# Patient Record
Sex: Female | Born: 1980 | Race: Black or African American | Hispanic: No | Marital: Single | Smoking: Never smoker
Health system: Southern US, Community
[De-identification: ages and names within clinical notes are randomized; demographics above are authoritative.]

## PROBLEM LIST (undated history)

## (undated) DIAGNOSIS — T7840XA Allergy, unspecified, initial encounter: Secondary | ICD-10-CM

## (undated) HISTORY — PX: CHOLECYSTECTOMY: SHX55

## (undated) HISTORY — DX: Allergy, unspecified, initial encounter: T78.40XA

---

## 2010-02-14 ENCOUNTER — Emergency Department (HOSPITAL_COMMUNITY)
Admission: EM | Admit: 2010-02-14 | Discharge: 2010-02-14 | Disposition: A | Discharge status: Home / Self Care | Payer: Self-pay | Attending: Emergency Medicine | Admitting: Emergency Medicine

## 2010-02-14 DIAGNOSIS — Z0389 Encounter for observation for other suspected diseases and conditions ruled out: Secondary | ICD-10-CM | POA: Insufficient documentation

## 2012-04-24 ENCOUNTER — Ambulatory Visit (INDEPENDENT_AMBULATORY_CARE_PROVIDER_SITE_OTHER): Payer: BC Managed Care – PPO | Admitting: Internal Medicine

## 2012-04-24 VITALS — BP 114/72 | HR 81 | Temp 98.9°F | Resp 17 | Ht 63.5 in | Wt 142.0 lb

## 2012-04-24 DIAGNOSIS — Z0000 Encounter for general adult medical examination without abnormal findings: Secondary | ICD-10-CM

## 2012-04-24 DIAGNOSIS — T7840XA Allergy, unspecified, initial encounter: Secondary | ICD-10-CM | POA: Insufficient documentation

## 2012-04-24 DIAGNOSIS — Z91030 Bee allergy status: Secondary | ICD-10-CM

## 2012-04-24 DIAGNOSIS — D1803 Hemangioma of intra-abdominal structures: Secondary | ICD-10-CM

## 2012-04-24 DIAGNOSIS — Z91038 Other insect allergy status: Secondary | ICD-10-CM

## 2012-04-24 MED ORDER — EPINEPHRINE 0.3 MG/0.3ML IJ DEVI: 0.3000 mg | Freq: Once | INTRAMUSCULAR | Status: DC

## 2012-04-24 NOTE — Progress Notes (Signed)
  Tuberculosis Risk Questionnaire  1. Were you born outside the [place] in one of the following parts of the world:    [place], [place], [place], [place] or [place]?  No  2. Have you traveled outside the [place] and lived for more than one month in one of the following parts of the world:  [place], [place], [place], [place] or [place]?  Yes/briefly to [employer]  3. Do you have a compromised immune system such as from any of the following conditions:  HIV/AIDS, organ or bone marrow transplantation, diabetes, immunosuppressive   medicines (e.g. Prednisone, Remicaide), leukemia, lymphoma, cancer of the   head or neck, gastrectomy or jejunal bypass, end-stage renal disease (on   dialysis), or silicosis?  No    4. Have you ever done one of the following:    Used crack cocaine, injected illegal drugs, worked or resided in jail or prison,   worked or resided at a homeless shelter, or worked as a Research scientist (physical sciences) in   direct contact with patients?  no             Yes           no  5. Have you ever been exposed to anyone with infectious tuberculosis?  No   Tuberculosis Symptom Questionnaire  Do you currently have any of the following symptoms?  1. Unexplained cough lasting more than 3 weeks? No  Unexplained fever lasting more than 3 weeks. No   3. Night Sweats (sweating that leaves the bedclothes and sheets wet)   No  4. Shortness of Breath No  5. Chest Pain No  6. Unintentional weight loss  No  7. Unexplained fatigue (very tired for no reason) No  To Whom It May Concern:  This patient was screened for tuberculosis using current guidelines from the Centers for Disease Control, and does not have tuberculosis in the communicable form.

## 2012-04-26 NOTE — Progress Notes (Signed)
  Subjective:    Patient ID: Leah Green, female    DOB: [birth year], 32 y.o.   MRN: [id number]  HPI she wants an annual checkup with Pap smear and consideration for exposure to communicable diseases in preparation for a new job She currently remains asymptomatic with the following problems Patient Active Problem List  Diagnosis  . Hemangioma of intra-abdominal structure since age 2 -multiple procedures have not been able to stop this -she has never been anemic from it-it has been several years since that has been checked and her problems are currently infrequent so she wishes to do nothing else at this point   . 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  HENT: Negative.   Eyes: Negative.   Respiratory: Negative for cough, shortness of breath and wheezing.   Cardiovascular: Negative for chest pain, palpitations and leg swelling.  Gastrointestinal: Negative.   Endocrine: Negative.   Genitourinary: Negative.   Musculoskeletal: Negative.   Skin: Negative.   Allergic/Immunologic: Negative.   Neurological: Negative.   Hematological: Negative.   Psychiatric/Behavioral: Negative.        Objective:   Physical Exam  Constitutional: She is oriented to person, place, and time. She appears well-developed and well-nourished.  HENT:  Head: Normocephalic.  Right Ear: External ear normal.  Left Ear: External ear normal.  Nose: Nose normal.  Mouth/Throat: Oropharynx is  clear and moist.  Eyes: Conjunctivae and EOM are normal. Pupils are equal, round, and reactive to light.  Neck: Normal range of motion. Neck supple. No thyromegaly present.  Cardiovascular: Normal rate and regular rhythm.   Pulmonary/Chest: Effort normal.  Musculoskeletal: Normal range of motion. She exhibits no edema.  Neurological: She is alert and oriented to person, place, and time. No cranial nerve deficit.  Skin: No rash noted.  Psychiatric: She has a normal mood and affect. Her behavior is normal. Judgment and thought content normal.          Assessment & Plan:  Annual exam   Problems Allergic to bees - Plan: EPINEPHrine (EPIPEN) 0.3 mg/0.3 mL DEVI  Hemangioma of intra-abdominal structure ??--Last colonoscopy 2008/no need for followup at this point She declines further lab work   Letter regarding TB

## 2012-08-14 ENCOUNTER — Ambulatory Visit: Payer: BC Managed Care – PPO

## 2012-08-14 ENCOUNTER — Ambulatory Visit (INDEPENDENT_AMBULATORY_CARE_PROVIDER_SITE_OTHER): Payer: BC Managed Care – PPO | Admitting: Internal Medicine

## 2012-08-14 VITALS — BP 98/60 | HR 81 | Temp 97.9°F | Resp 18 | Ht 63.5 in | Wt 142.0 lb

## 2012-08-14 DIAGNOSIS — M25579 Pain in unspecified ankle and joints of unspecified foot: Secondary | ICD-10-CM

## 2012-08-14 DIAGNOSIS — M25532 Pain in left wrist: Secondary | ICD-10-CM

## 2012-08-14 DIAGNOSIS — S42309S Unspecified fracture of shaft of humerus, unspecified arm, sequela: Secondary | ICD-10-CM

## 2012-08-14 DIAGNOSIS — S62102S Fracture of unspecified carpal bone, left wrist, sequela: Secondary | ICD-10-CM

## 2012-08-14 MED ORDER — IBUPROFEN 600 MG PO TABS: 600.0000 mg | ORAL_TABLET | Freq: Three times a day (TID) | ORAL | Status: DC | PRN

## 2012-08-14 NOTE — Patient Instructions (Signed)
Wrist Pain  Wrist injuries are frequent in adults and children. A sprain is an injury to the ligaments that hold your bones together. A strain is an injury to muscle or muscle cord-like structures (tendons) from stretching or pulling. Generally, when wrists are moderately tender to touch following a fall or injury, a break in the bone (fracture) may be present. Most wrist sprains or strains are better in 3 to 5 days, but complete healing may take several weeks.  HOME CARE INSTRUCTIONS    Put ice on the injured area.   Put ice in a plastic bag.   Place a towel between your skin and the bag.   Leave the ice on for 15-20 minutes, 3-4 times a day, for the first 2 days.   Keep your arm raised above the level of your heart whenever possible to reduce swelling and pain.   Rest the injured area for at least 48 hours or as directed by your caregiver.   If a splint or elastic bandage has been applied, use it for as long as directed by your caregiver or until seen by a caregiver for a follow-up exam.   Only take over-the-counter or prescription medicines for pain, discomfort, or fever as directed by your caregiver.   Keep all follow-up appointments. You may need to follow up with a specialist or have follow-up X-rays. Improvement in pain level is not a guarantee that you did not fracture a bone in your wrist. The only way to determine whether or not you have a broken bone is by X-ray.  SEEK IMMEDIATE MEDICAL CARE IF:    Your fingers are swollen, very red, white, or cold and blue.   Your fingers are numb or tingling.   You have increasing pain.   You have difficulty moving your fingers.  MAKE SURE YOU:    Understand these instructions.   Will watch your condition.   Will get help right away if you are not doing well or get worse.  Document Released: 10/10/2004 Document Revised: 03/25/2011 Document Reviewed: 02/21/2010  [employer] Patient Information 2014 [employer].
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
## 2012-08-26 ENCOUNTER — Other Ambulatory Visit: Payer: Self-pay | Admitting: Orthopaedic Surgery

## 2012-08-26 DIAGNOSIS — M25532 Pain in left wrist: Secondary | ICD-10-CM

## 2012-09-08 ENCOUNTER — Ambulatory Visit
Admission: RE | Admit: 2012-09-08 | Discharge: 2012-09-08 | Disposition: A | Discharge status: Home / Self Care | Payer: BC Managed Care – PPO | Source: Ambulatory Visit | Attending: Orthopaedic Surgery | Admitting: Orthopaedic Surgery

## 2012-09-08 DIAGNOSIS — M25532 Pain in left wrist: Secondary | ICD-10-CM

## 2012-09-08 MED ORDER — IOHEXOL 180 MG/ML  SOLN
3.0000 mL | Freq: Once | INTRAMUSCULAR | Status: AC | PRN
  Administered 2012-09-08: 1 mL via INTRA_ARTICULAR

## 2012-12-14 ENCOUNTER — Ambulatory Visit (INDEPENDENT_AMBULATORY_CARE_PROVIDER_SITE_OTHER): Payer: BC Managed Care – PPO | Admitting: Physician Assistant

## 2012-12-14 VITALS — BP 108/70 | HR 90 | Temp 98.6°F | Resp 16 | Ht 63.5 in | Wt 144.0 lb

## 2012-12-14 DIAGNOSIS — Z113 Encounter for screening for infections with a predominantly sexual mode of transmission: Secondary | ICD-10-CM

## 2012-12-14 DIAGNOSIS — L309 Dermatitis, unspecified: Secondary | ICD-10-CM

## 2012-12-14 DIAGNOSIS — L259 Unspecified contact dermatitis, unspecified cause: Secondary | ICD-10-CM

## 2012-12-14 MED ORDER — MOMETASONE FUROATE 0.1 % EX OINT: TOPICAL_OINTMENT | Freq: Every day | CUTANEOUS | Status: DC

## 2012-12-14 NOTE — Patient Instructions (Signed)
Continue using the Elocon ointment as directed.  I will let you know when your labs are back and if we need to do anything based on those   Safe Sex Safe sex is about reducing the risk of giving or getting a sexually transmitted disease (STD). STDs are spread through sexual contact involving the genitals, mouth, or rectum. Some STDS can be cured and others cannot. Safe sex can also prevent unintended pregnancies.  SAFE SEX PRACTICES  Limit your sexual activity to only one partner who is only having sex with you.  Talk to your partner about their past partners, past STDs, and drug use.  Use a condom every time you have sexual intercourse. This includes vaginal, oral, and anal sexual activity. Both females and males should wear condoms during oral sex. Only use latex or polyurethane condoms and water-based lubricants. Petroleum-based lubricants or oils used to lubricate a condom will weaken the condom and increase the chance that it will break. The condom should be in place from the beginning to the end of sexual activity. Wearing a condom reduces, but does not completely eliminate, your risk of getting or giving a STD. STDs can be spread by contact with skin of surrounding areas.  Get vaccinated for hepatitis B and HPV.  Avoid alcohol and recreational drugs which can affect your judgement. You may forget to use a condom or participate in high-risk sex.  For females, avoid douching after sexual intercourse. Douching can spread an infection farther into the reproductive tract.  Check your body for signs of sores, blisters, rashes, or unusual discharge. See your caregiver if you notice any of these signs.  Avoid sexual contact if you have symptoms of an infection or are being treated for an STD. If you or your partner has herpes, avoid sexual contact when blisters are present. Use condoms at all other times.  See your caregiver for regular screenings, examinations, and tests for STDs. Before  having sex with a new partner, each of you should be screened for STDs and talk about the results with your partner. BENEFITS OF SAFE SEX   There is less of a chance of getting or giving an STD.  You can prevent unwanted or unintended pregnancies.  By discussing safer sex concerns with your partner, you may increase feelings of intimacy, comfort, trust, and honesty between the both of you. Document Released: 02/08/2004 Document Revised: 09/25/2011 Document Reviewed: 06/24/2011 [facility] Patient Information 2014 [place], [place].

## 2012-12-14 NOTE — Progress Notes (Signed)
   Subjective:    Patient ID: Leah Green, female    DOB: 08-22-1980, 32 y.o.   MRN: [id number]  HPI   Leah Green is a very pleasant 32 yr old female here with two requests today: (1)  Long history of eczema since childhood.  "Allergic to my own body fluids."  Sweat in particular exacerbates symptoms.  Treated with Elocon ointment since childhood.  Does not use daily, only with flare ups.   (2)  Requests STI testing today.  Last tested more than 6 months ago.  Sexually active with 1 female partner.  They use condoms about 90% of the time.  They are in a long-distance relationship.  She is not using any other contraception and is not trying to conceive.  Not interested in contraception today.  LMP 11/16/12, regular every month.   Review of Systems  Constitutional: Negative for fever and chills.  Respiratory: Negative for cough, shortness of breath and wheezing.   Gastrointestinal: Negative.   Musculoskeletal: Negative.   Skin: Negative for rash and wound.  Neurological: Negative.        Objective:   Physical Exam  Vitals reviewed. Constitutional: She is oriented to person, place, and time. She appears well-developed and well-nourished. No distress.  HENT:  Head: Normocephalic and atraumatic.  Eyes: Conjunctivae are normal. No scleral icterus.  Cardiovascular: Normal rate, regular rhythm and normal heart sounds.   Pulmonary/Chest: Effort normal and breath sounds normal. She has no wheezes. She has no rales.  Abdominal: Soft. There is no tenderness.  Neurological: She is alert and oriented to person, place, and time.  Skin: Skin is warm and dry.  Psychiatric: She has a normal mood and affect. Her behavior is normal.       Assessment & Plan:  Screen for STD (sexually transmitted disease) - Plan: GC/Chlamydia Probe Amp, RPR, HIV antibody  Eczema - Plan: mometasone (ELOCON) 0.1 % ointment   Leah Green is a 32 yr old female here for STD testing.  Self-collected gen probe, HIV,  and RPR sent.  Will follow up on labs and treat if necessary.  I have also refilled Elocon ointment for pt.  To RTC as needs arise.   Meds ordered this encounter  Medications  . DISCONTD: mometasone (ELOCON) 0.1 % ointment    Sig: Apply topically daily.  . mometasone (ELOCON) 0.1 % ointment    Sig: Apply topically daily.    Dispense:  45 g    Refill:  1    Order Specific Question:  Supervising Provider    Answer:  Nilda Simmer M [[id number]]    Loleta Dicker MHS, PA-C [facility] 12/1/20143:54 PM

## 2013-04-03 ENCOUNTER — Ambulatory Visit (INDEPENDENT_AMBULATORY_CARE_PROVIDER_SITE_OTHER): Payer: BC Managed Care – PPO | Admitting: Physician Assistant

## 2013-04-03 VITALS — BP 100/70 | HR 73 | Temp 98.3°F | Resp 18 | Ht 63.75 in | Wt 145.0 lb

## 2013-04-03 DIAGNOSIS — L259 Unspecified contact dermatitis, unspecified cause: Secondary | ICD-10-CM

## 2013-04-03 DIAGNOSIS — Z113 Encounter for screening for infections with a predominantly sexual mode of transmission: Secondary | ICD-10-CM

## 2013-04-03 DIAGNOSIS — L309 Dermatitis, unspecified: Secondary | ICD-10-CM

## 2013-04-03 DIAGNOSIS — Z309 Encounter for contraceptive management, unspecified: Secondary | ICD-10-CM

## 2013-04-03 LAB — HIV ANTIBODY (ROUTINE TESTING W REFLEX): HIV: NONREACTIVE

## 2013-04-03 LAB — POCT URINE PREGNANCY: PREG TEST UR: NEGATIVE

## 2013-04-03 LAB — RPR

## 2013-04-03 MED ORDER — NORETHIN ACE-ETH ESTRAD-FE 1-20 MG-MCG PO TABS: 1.0000 | ORAL_TABLET | Freq: Every day | ORAL | Status: DC

## 2013-04-03 MED ORDER — MOMETASONE FUROATE 0.1 % EX OINT: TOPICAL_OINTMENT | Freq: Every day | CUTANEOUS | Status: DC

## 2013-04-03 NOTE — Patient Instructions (Signed)
Start the birth control pill today or tomorrow - make sure to take at about the same time every day.  It is not uncommon for women to have abnormal bleeding or spotting as your body adjusts to the medicine, but this typically normalizes by 3 months.  Continue using condoms with every encounter as this is the best way to protect yourself from infection  I think the Nexplanon may be a great option for you.  Let me know if you would like a referral to GYN for this  I will let you know when labs are back

## 2013-04-03 NOTE — Progress Notes (Signed)
   Subjective:    Patient ID: Leah Green, female    DOB: 1980/07/02, 33 y.o.   MRN: [id number]   HPI   Ms. Leah Green is a very pleasant 33 yr old female here requesting refill on Elocon ointment.  Pt uses prn for eczema.  She states she is "allergic" to her own body fluids, so any time she sweats she develops eczema.  She tolerates this medication well and has used for years  Additionally she would like STI testing.  Last testing in Dec 2014 - all neg at that time, but new partner since.  They are using condoms 100% of the time.  She is asymptomatic.  LMP 03/31/13 - periods irregular last couple of months.  She is interested in starting contraception today.  Previously unable to faithfully take pills.  Significant weight gain with depo.  No interested in NuvaRing or IUD.  She is open to Nexplanon but would like to start an OCP today.  She gets migraines and recalls being on a low dose pill previously.  She is a non smoker.  No personal or family hx blood clots.   Review of Systems  Constitutional: Negative.   Respiratory: Negative.   Cardiovascular: Negative.   Gastrointestinal: Negative.   Genitourinary: Negative.   Skin: Negative.        Objective:   Physical Exam  Vitals reviewed. Constitutional: She is oriented to person, place, and time. She appears well-developed and well-nourished. No distress.  HENT:  Head: Normocephalic and atraumatic.  Eyes: Conjunctivae are normal. No scleral icterus.  Cardiovascular: Normal rate, regular rhythm and normal heart sounds.   Pulmonary/Chest: Effort normal and breath sounds normal. She has no wheezes. She has no rales.  Neurological: She is alert and oriented to person, place, and time.  Skin: Skin is warm and dry.  Psychiatric: She has a normal mood and affect. Her behavior is normal.      Results for orders placed in visit on 04/03/13  POCT URINE PREGNANCY      Result Value Ref Range   Preg Test, Ur Negative         Assessment &  Plan:  Screen for STD (sexually transmitted disease) - Plan: GC/Chlamydia Probe Amp, HIV antibody, RPR, norethindrone-ethinyl estradiol (JUNEL FE,GILDESS FE,LOESTRIN FE) 1-20 MG-MCG tablet  Contraception management - Plan: POCT urine pregnancy  Eczema - Plan: mometasone (ELOCON) 0.1 % ointment   Ms. Leah Green is a very pleasant 33 yr old female here for STI testing - self collected gen probe, HIV, RPR sent.  Will follow up on labs and treat if necessary.  We discussed contraception today - she would like to start OCPs.  Will start loestrin which has only 81mcg estrogen.  Discussed the possibility of an even lower dose with lo loestrin - though this may be costly as I think it is still a branded drug.  Additionally a progesterone only method like Nexplanon may work well for her.  I have given her information on this and she will let me know if she would like a ref to GYN.  I have refilled Elocon per her request  Pt to call or RTC if worsening or not improving  E. Natividad Brood MHS, PA-C [facility] 3/21/20154:04 PM

## 2013-04-05 LAB — GC/CHLAMYDIA PROBE AMP
CT Probe RNA: NEGATIVE
GC Probe RNA: NEGATIVE

## 2013-08-16 ENCOUNTER — Ambulatory Visit (INDEPENDENT_AMBULATORY_CARE_PROVIDER_SITE_OTHER): Payer: BC Managed Care – PPO

## 2013-08-16 ENCOUNTER — Ambulatory Visit (INDEPENDENT_AMBULATORY_CARE_PROVIDER_SITE_OTHER): Payer: BC Managed Care – PPO | Admitting: Family Medicine

## 2013-08-16 VITALS — BP 110/78 | HR 78 | Temp 97.7°F | Resp 16 | Ht 63.75 in | Wt 148.8 lb

## 2013-08-16 DIAGNOSIS — T148XXA Other injury of unspecified body region, initial encounter: Secondary | ICD-10-CM

## 2013-08-16 DIAGNOSIS — M542 Cervicalgia: Secondary | ICD-10-CM

## 2013-08-16 DIAGNOSIS — N912 Amenorrhea, unspecified: Secondary | ICD-10-CM

## 2013-08-16 LAB — POCT URINE PREGNANCY: Preg Test, Ur: NEGATIVE

## 2013-08-16 MED ORDER — CYCLOBENZAPRINE HCL 10 MG PO TABS: ORAL_TABLET | ORAL | Status: DC

## 2013-08-16 MED ORDER — PREDNISONE 10 MG PO TABS: ORAL_TABLET | ORAL | Status: DC

## 2013-08-16 MED ORDER — OXYCODONE-ACETAMINOPHEN 5-325 MG PO TABS: 1.0000 | ORAL_TABLET | Freq: Three times a day (TID) | ORAL | Status: DC | PRN
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
## 2013-08-16 NOTE — Patient Instructions (Signed)
Cervical Strain and Sprain (Whiplash) with Rehab Cervical strain and sprain are injuries that commonly occur with "whiplash" injuries. Whiplash occurs when the neck is forcefully whipped backward or forward, such as during a motor vehicle accident or during contact sports. The muscles, ligaments, tendons, discs, and nerves of the neck are susceptible to injury when this occurs. RISK FACTORS Risk of having a whiplash injury increases if:  Osteoarthritis of the spine.  Situations that make head or neck accidents or trauma more likely.  High-risk sports (football, rugby, wrestling, hockey, auto racing, gymnastics, diving, contact karate, or boxing).  Poor strength and flexibility of the neck.  Previous neck injury.  Poor tackling technique.  Improperly fitted or padded equipment. SYMPTOMS   Pain or stiffness in the front or back of neck or both.  Symptoms may present immediately or up to 24 hours after injury.  Dizziness, headache, nausea, and vomiting.  Muscle spasm with soreness and stiffness in the neck.  Tenderness and swelling at the injury site. PREVENTION  Learn and use proper technique (avoid tackling with the head, spearing, and head-butting; use proper falling techniques to avoid landing on the head).  Warm up and stretch properly before activity.  Maintain physical fitness:  Strength, flexibility, and endurance.  Cardiovascular fitness.  Wear properly fitted and padded protective equipment, such as padded soft collars, for participation in contact sports. PROGNOSIS  Recovery from cervical strain and sprain injuries is dependent on the extent of the injury. These injuries are usually curable in 1 week to 3 months with appropriate treatment.  RELATED COMPLICATIONS   Temporary numbness and weakness may occur if the nerve roots are damaged, and this may persist until the nerve has completely healed.  Chronic pain due to frequent recurrence of  symptoms.  Prolonged healing, especially if activity is resumed too soon (before complete recovery). TREATMENT  Treatment initially involves the use of ice and medication to help reduce pain and inflammation. It is also important to perform strengthening and stretching exercises and modify activities that worsen symptoms so the injury does not get worse. These exercises may be performed at home or with a therapist. For patients who experience severe symptoms, a soft, padded collar may be recommended to be worn around the neck.  Improving your posture may help reduce symptoms. Posture improvement includes pulling your chin and abdomen in while sitting or standing. If you are sitting, sit in a firm chair with your buttocks against the back of the chair. While sleeping, try replacing your pillow with a small towel rolled to 2 inches in diameter, or use a cervical pillow or soft cervical collar. Poor sleeping positions delay healing.  For patients with nerve root damage, which causes numbness or weakness, the use of a cervical traction apparatus may be recommended. Surgery is rarely necessary for these injuries. However, cervical strain and sprains that are present at birth (congenital) may require surgery. MEDICATION   If pain medication is necessary, nonsteroidal anti-inflammatory medications, such as aspirin and ibuprofen, or other minor pain relievers, such as acetaminophen, are often recommended.  Do not take pain medication for 7 days before surgery.  Prescription pain relievers may be given if deemed necessary by your caregiver. Use only as directed and only as much as you need. HEAT AND COLD:   Cold treatment (icing) relieves pain and reduces inflammation. Cold treatment should be applied for 10 to 15 minutes every 2 to 3 hours for inflammation and pain and immediately after any activity that aggravates   your symptoms. Use ice packs or an ice massage.  Heat treatment may be used prior to  performing the stretching and strengthening activities prescribed by your caregiver, physical therapist, or athletic trainer. Use a heat pack or a warm soak. SEEK MEDICAL CARE IF:   Symptoms get worse or do not improve in 2 weeks despite treatment.  New, unexplained symptoms develop (drugs used in treatment may produce side effects). EXERCISES RANGE OF MOTION (ROM) AND STRETCHING EXERCISES - Cervical Strain and Sprain These exercises may help you when beginning to rehabilitate your injury. In order to successfully resolve your symptoms, you must improve your posture. These exercises are designed to help reduce the forward-head and rounded-shoulder posture which contributes to this condition. Your symptoms may resolve with or without further involvement from your physician, physical therapist or athletic trainer. While completing these exercises, remember:   Restoring tissue flexibility helps normal motion to return to the joints. This allows healthier, less painful movement and activity.  An effective stretch should be held for at least 20 seconds, although you may need to begin with shorter hold times for comfort.  A stretch should never be painful. You should only feel a gentle lengthening or release in the stretched tissue. STRETCH- Axial Extensors  Lie on your back on the floor. You may bend your knees for comfort. Place a rolled-up hand towel or dish towel, about 2 inches in diameter, under the part of your head that makes contact with the floor.  Gently tuck your chin, as if trying to make a "double chin," until you feel a gentle stretch at the base of your head.  Hold __________ seconds. Repeat __________ times. Complete this exercise __________ times per day.  STRETCH - Axial Extension   Stand or sit on a firm surface. Assume a good posture: chest up, shoulders drawn back, abdominal muscles slightly tense, knees unlocked (if standing) and feet hip width apart.  Slowly retract your  chin so your head slides back and your chin slightly lowers. Continue to look straight ahead.  You should feel a gentle stretch in the back of your head. Be certain not to feel an aggressive stretch since this can cause headaches later.  Hold for __________ seconds. Repeat __________ times. Complete this exercise __________ times per day. STRETCH - Cervical Side Bend   Stand or sit on a firm surface. Assume a good posture: chest up, shoulders drawn back, abdominal muscles slightly tense, knees unlocked (if standing) and feet hip width apart.  Without letting your nose or shoulders move, slowly tip your right / left ear to your shoulder until your feel a gentle stretch in the muscles on the opposite side of your neck.  Hold __________ seconds. Repeat __________ times. Complete this exercise __________ times per day. STRETCH - Cervical Rotators   Stand or sit on a firm surface. Assume a good posture: chest up, shoulders drawn back, abdominal muscles slightly tense, knees unlocked (if standing) and feet hip width apart.  Keeping your eyes level with the ground, slowly turn your head until you feel a gentle stretch along the back and opposite side of your neck.  Hold __________ seconds. Repeat __________ times. Complete this exercise __________ times per day. RANGE OF MOTION - Neck Circles   Stand or sit on a firm surface. Assume a good posture: chest up, shoulders drawn back, abdominal muscles slightly tense, knees unlocked (if standing) and feet hip width apart.  Gently roll your head down and around from the   back of one shoulder to the back of the other. The motion should never be forced or painful.  Repeat the motion 10-20 times, or until you feel the neck muscles relax and loosen. Repeat __________ times. Complete the exercise __________ times per day. STRENGTHENING EXERCISES - Cervical Strain and Sprain These exercises may help you when beginning to rehabilitate your injury. They may  resolve your symptoms with or without further involvement from your physician, physical therapist, or athletic trainer. While completing these exercises, remember:   Muscles can gain both the endurance and the strength needed for everyday activities through controlled exercises.  Complete these exercises as instructed by your physician, physical therapist, or athletic trainer. Progress the resistance and repetitions only as guided.  You may experience muscle soreness or fatigue, but the pain or discomfort you are trying to eliminate should never worsen during these exercises. If this pain does worsen, stop and make certain you are following the directions exactly. If the pain is still present after adjustments, discontinue the exercise until you can discuss the trouble with your clinician. STRENGTH - Cervical Flexors, Isometric  Face a wall, standing about 6 inches away. Place a small pillow, a ball about 6-8 inches in diameter, or a folded towel between your forehead and the wall.  Slightly tuck your chin and gently push your forehead into the soft object. Push only with mild to moderate intensity, building up tension gradually. Keep your jaw and forehead relaxed.  Hold 10 to 20 seconds. Keep your breathing relaxed.  Release the tension slowly. Relax your neck muscles completely before you start the next repetition. Repeat __________ times. Complete this exercise __________ times per day. STRENGTH- Cervical Lateral Flexors, Isometric   Stand about 6 inches away from a wall. Place a small pillow, a ball about 6-8 inches in diameter, or a folded towel between the side of your head and the wall.  Slightly tuck your chin and gently tilt your head into the soft object. Push only with mild to moderate intensity, building up tension gradually. Keep your jaw and forehead relaxed.  Hold 10 to 20 seconds. Keep your breathing relaxed.  Release the tension slowly. Relax your neck muscles completely  before you start the next repetition. Repeat __________ times. Complete this exercise __________ times per day. STRENGTH - Cervical Extensors, Isometric   Stand about 6 inches away from a wall. Place a small pillow, a ball about 6-8 inches in diameter, or a folded towel between the back of your head and the wall.  Slightly tuck your chin and gently tilt your head back into the soft object. Push only with mild to moderate intensity, building up tension gradually. Keep your jaw and forehead relaxed.  Hold 10 to 20 seconds. Keep your breathing relaxed.  Release the tension slowly. Relax your neck muscles completely before you start the next repetition. Repeat __________ times. Complete this exercise __________ times per day. POSTURE AND BODY MECHANICS CONSIDERATIONS - Cervical Strain and Sprain Keeping correct posture when sitting, standing or completing your activities will reduce the stress put on different body tissues, allowing injured tissues a chance to heal and limiting painful experiences. The following are general guidelines for improved posture. Your physician or physical therapist will provide you with any instructions specific to your needs. While reading these guidelines, remember:  The exercises prescribed by your provider will help you have the flexibility and strength to maintain correct postures.  The correct posture provides the optimal environment for your joints to   work. All of your joints have less wear and tear when properly supported by a spine with good posture. This means you will experience a healthier, less painful body.  Correct posture must be practiced with all of your activities, especially prolonged sitting and standing. Correct posture is as important when doing repetitive low-stress activities (typing) as it is when doing a single heavy-load activity (lifting). PROLONGED STANDING WHILE SLIGHTLY LEANING FORWARD When completing a task that requires you to lean  forward while standing in one place for a long time, place either foot up on a stationary 2- to 4-inch high object to help maintain the best posture. When both feet are on the ground, the low back tends to lose its slight inward curve. If this curve flattens (or becomes too large), then the back and your other joints will experience too much stress, fatigue more quickly, and can cause pain.  RESTING POSITIONS Consider which positions are most painful for you when choosing a resting position. If you have pain with flexion-based activities (sitting, bending, stooping, squatting), choose a position that allows you to rest in a less flexed posture. You would want to avoid curling into a fetal position on your side. If your pain worsens with extension-based activities (prolonged standing, working overhead), avoid resting in an extended position such as sleeping on your stomach. Most people will find more comfort when they rest with their spine in a more neutral position, neither too rounded nor too arched. Lying on a non-sagging bed on your side with a pillow between your knees, or on your back with a pillow under your knees will often provide some relief. Keep in mind, being in any one position for a prolonged period of time, no matter how correct your posture, can still lead to stiffness. WALKING Walk with an upright posture. Your ears, shoulders, and hips should all line up. OFFICE WORK When working at a desk, create an environment that supports good, upright posture. Without extra support, muscles fatigue and lead to excessive strain on joints and other tissues. CHAIR:  A chair should be able to slide under your desk when your back makes contact with the back of the chair. This allows you to work closely.  The chair's height should allow your eyes to be level with the upper part of your monitor and your hands to be slightly lower than your elbows.  Body position:  Your feet should make contact with the  floor. If this is not possible, use a foot rest.  Keep your ears over your shoulders. This will reduce stress on your neck and low back. Document Released: 12/31/2004 Document Revised: 05/17/2013 Document Reviewed: 04/14/2008 [employer] Patient Information 2015 [employer]. This information is not intended to replace advice given to you by your health care provider. Make sure you discuss any questions you have with your health care provider.  

## 2014-08-05 ENCOUNTER — Ambulatory Visit (INDEPENDENT_AMBULATORY_CARE_PROVIDER_SITE_OTHER): Payer: Self-pay | Admitting: Family Medicine

## 2014-08-05 VITALS — BP 118/70 | HR 86 | Temp 99.6°F | Resp 18 | Ht 63.25 in | Wt 154.2 lb

## 2014-08-05 DIAGNOSIS — Z0000 Encounter for general adult medical examination without abnormal findings: Secondary | ICD-10-CM

## 2014-08-05 NOTE — Patient Instructions (Signed)
No concerns identified on your physical today. Follow up if needed.

## 2014-08-05 NOTE — Progress Notes (Addendum)
Subjective:  This chart was scribed for Leah Ray, MD by [facility], medical scribe at [facility].The patient was seen in exam room 04 and the patient's care was started at 5:59 PM.   Patient ID: Leah Green, female    DOB: May 09, 1980, 34 y.o.   MRN: [id number] Chief Complaint  Patient presents with  . Foster Care Physical   HPI HPI Comments: Leah Green is a 34 y.o. female who presents to [facility] for a foster care physical exam. No chronic medical problems. No regular medications. She has two foster kids 2 and 5 who are going to be adopted in mid August. 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   Patient Active Problem List   Diagnosis Date Noted  . Hemangioma of intra-abdominal structure ??-- 04/24/2012  . 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 Allergy    Past Surgical History  Procedure Laterality Date  . Cholecystectomy     Allergies  Allergen Reactions  . Bee Venom Anaphylaxis   Prior to Admission medications   Medication Sig Start Date End Date Taking? Authorizing Provider  cyclobenzaprine (FLEXERIL) 10 MG tablet Take 1/2-1 tab po TID prn for muscle spasms. Makes you drowsy Patient not taking: Reported on 08/05/2014 08/16/13   Leah P Le, DO  EPINEPHrine (EPIPEN) 0.3 mg/0.3 mL DEVI Inject 0.3 mLs (0.3 mg total) into the muscle once. Patient not taking: Reported on 08/05/2014 04/24/12   Leah Koyanagi, MD  ibuprofen (ADVIL,MOTRIN) 600 MG tablet Take 1 tablet (600 mg total) by mouth every 8 (eight) hours as needed for pain. Patient not taking: Reported on 08/05/2014 08/14/12   Leah Moulder Guest, MD  mometasone (ELOCON) 0.1 % ointment Apply topically daily. Patient not taking: Reported on 08/05/2014 04/03/13   Leah Sers, PA-C  norethindrone-ethinyl estradiol (JUNEL FE,GILDESS FE,LOESTRIN FE) 1-20 MG-MCG tablet Take 1 tablet by mouth daily. Patient not taking: Reported on  08/05/2014 04/03/13   Leah Sers, PA-C  oxyCODONE-acetaminophen (ROXICET) 5-325 MG per tablet Take 1 tablet by mouth every 8 (eight) hours as needed for severe pain. Take with stool softener prn Patient not taking: Reported on 08/05/2014 08/16/13   Leah P Le, DO  predniSONE (DELTASONE) 10 MG tablet Take 4 tabs PO daily for 3 days, then 3 tabs PO daily for 3 days, then 2 tabs daily for 3 days, then 1 tab daily for 3 days, No other NSAIDs Patient not taking: Reported on 08/05/2014 08/16/13   Leah Bayley, DO   History   Social History  . Marital Status: Single    Spouse Name: N/A  . Number of Children: N/A  . Years of Education: N/A   Occupational History  . Not on file.   Social History Main Topics  . Smoking status: Never Smoker   . Smokeless tobacco: Never Used  . Alcohol Use: 0.6 oz/week    1 Glasses of wine per week  . Drug Use: No  . Sexual Activity: Yes    Birth Control/ Protection: None   Other Topics Concern  . Not on file   Social History Narrative   Review of Systems  Constitutional: Negative for chills and unexpected weight change.  Respiratory: Negative for cough.        Objective:  BP 118/70 mmHg  Pulse 86  Temp(Src) 99.6 F (37.6 C) (Oral)  Resp 18  Ht 5' 3.25" (1.607 m)  Wt 154 lb 3.2  oz (69.945 kg)  BMI 27.08 kg/m2  SpO2 99%  LMP 07/12/2014 Physical Exam  Constitutional: She is oriented to person, place, and time. She appears well-developed and well-nourished. No distress.  HENT:  Head: Normocephalic and atraumatic.  Eyes: Pupils are equal, round, and reactive to light.  Neck: Normal range of motion.  Cardiovascular: Normal rate and regular rhythm.   Pulmonary/Chest: Effort normal. No respiratory distress.  Musculoskeletal: Normal range of motion.  Neurological: She is alert and oriented to person, place, and time.  Skin: Skin is warm and dry.  Psychiatric: She has a normal mood and affect. Her behavior is normal.  Nursing note and vitals  reviewed.     Assessment & Plan:   Leah Green is a 34 y.o. female Encounter for limited medical examination  -physical/health review for serving as foster parent.   -no concerning history or medical problems known - form completed - see copy.   No orders of the defined types were placed in this encounter.   Patient Instructions  No concerns identified on your physical today. Follow up if needed.    I personally performed the services described in this documentation, which was scribed in my presence. The recorded information has been reviewed and considered, and addended by me as needed.

## 2014-08-24 ENCOUNTER — Ambulatory Visit (INDEPENDENT_AMBULATORY_CARE_PROVIDER_SITE_OTHER): Payer: Self-pay | Admitting: Emergency Medicine

## 2014-08-24 VITALS — BP 130/80 | HR 106 | Temp 99.2°F | Resp 16 | Ht 63.0 in | Wt 158.0 lb

## 2014-08-24 DIAGNOSIS — L02419 Cutaneous abscess of limb, unspecified: Secondary | ICD-10-CM

## 2014-08-24 DIAGNOSIS — L03119 Cellulitis of unspecified part of limb: Secondary | ICD-10-CM

## 2014-08-24 MED ORDER — SULFAMETHOXAZOLE-TRIMETHOPRIM 800-160 MG PO TABS: 1.0000 | ORAL_TABLET | Freq: Two times a day (BID) | ORAL | Status: DC

## 2014-08-24 NOTE — Progress Notes (Signed)
Subjective:  Patient ID: Leah Green, female    DOB: 03-Oct-1980  Age: 34 y.o. MRN: [id number]  CC: Insect Bite   HPI Bani Gianfrancesco presents  with a 5 day history of an increasing tender mass her posterior right thigh. She said she felt the pimple like lump the day after she shaved her legs. She popped that got a small amount of pus since that time it's been steadily increasing in size and draining. She's been putting Neosporin ointment on the lesion and it hasn't helped. She has no fever or chills. No improvement with over-the-counter medication. She has no other acute or chronic complaint  No outpatient prescriptions prior to visit.   No facility-administered medications prior to visit.    Social History   Social History  . Marital Status: Single    Spouse Name: N/A  . Number of Children: N/A  . Years of Education: N/A   Social History Main Topics  . Smoking status: Never Smoker   . Smokeless tobacco: Never Used  . Alcohol Use: 0.6 oz/week    1 Glasses of wine per week  . Drug Use: No  . Sexual Activity: Yes    Birth Control/ Protection: None   Other Topics Concern  . None   Social History Narrative    Family History  Problem Relation Age of Onset  . Alzheimer's disease Maternal Grandmother   . Diabetes Paternal Grandmother   . Epilepsy Sister   . Epilepsy Mother     Past Medical History  Diagnosis Date  . Allergy      Review of Systems  Constitutional: Negative for fever, chills and appetite change.  HENT: Negative for congestion, ear pain, postnasal drip, sinus pressure and sore throat.   Eyes: Negative for pain and redness.  Respiratory: Negative for cough, shortness of breath and wheezing.   Cardiovascular: Negative for leg swelling.  Gastrointestinal: Negative for nausea, vomiting, abdominal pain, diarrhea, constipation and blood in stool.  Endocrine: Negative for polyuria.  Genitourinary: Negative for dysuria, urgency, frequency and flank  pain.  Musculoskeletal: Negative for gait problem.  Skin: Negative for rash.  Neurological: Negative for weakness and headaches.  Psychiatric/Behavioral: Negative for confusion and decreased concentration. The patient is not nervous/anxious.     Objective:  BP 130/80 mmHg  Pulse 106  Temp(Src) 99.2 F (37.3 C) (Oral)  Resp 16  Ht 5\' 3"  (1.6 m)  Wt 158 lb (71.668 kg)  BMI 28.00 kg/m2  SpO2 99%  LMP 07/12/2014  BP Readings from Last 3 Encounters:  08/24/14 130/80  08/05/14 118/70  08/16/13 110/78    Wt Readings from Last 3 Encounters:  08/24/14 158 lb (71.668 kg)  08/05/14 154 lb 3.2 oz (69.945 kg)  08/16/13 148 lb 12.8 oz (67.495 kg)    Physical Exam  Constitutional: She is oriented to person, place, and time. She appears well-developed and well-nourished.  HENT:  Head: Normocephalic and atraumatic.  Eyes: Conjunctivae are normal. Pupils are equal, round, and reactive to light.  Pulmonary/Chest: Effort normal.  Musculoskeletal: She exhibits no edema.  Neurological: She is alert and oriented to person, place, and time.  Skin: Skin is dry. Rash noted. Rash is pustular.  Psychiatric: She has a normal mood and affect. Her behavior is normal. Thought content normal.    No results found for: WBC, HGB, HCT, PLT, GLUCOSE, CHOL, TRIG, HDL, LDLDIRECT, LDLCALC, ALT, AST, NA, K, CL, CREATININE, BUN, CO2, TSH, PSA, INR, GLUF, HGBA1C, MICROALBUR    .  Assessment &  Plan:   Simrin was seen today for insect bite.  Diagnoses and all orders for this visit:  Cellulitis and abscess of leg  Other orders -     sulfamethoxazole-trimethoprim (BACTRIM DS,SEPTRA DS) 800-160 MG per tablet; Take 1 tablet by mouth 2 (two) times daily.   I am having Ms. Linderman start on sulfamethoxazole-trimethoprim.  Meds ordered this encounter  Medications  . sulfamethoxazole-trimethoprim (BACTRIM DS,SEPTRA DS) 800-160 MG per tablet    Sig: Take 1 tablet by mouth 2 (two) times daily.     Dispense:  20 tablet    Refill:  0    Appropriate red flag conditions were discussed with the patient as well as actions that should be taken.  Patient expressed his understanding.  Follow-up: Return in about 1 day (around 08/25/2014).  Roselee Culver, MD

## 2014-08-24 NOTE — Patient Instructions (Signed)

## 2014-08-24 NOTE — Progress Notes (Signed)
Procedure: Risk and benefits discussed and verbal consent obtained. The patient was anesthetized using 5 cc of 2% lidocaine with epinephrine.  Wound opened and explored with curved hemostats x 2. Necrotic tissue removed. The wound was packed. The patient tolerated the procedure without difficulty.   A clean dressing was placed and wound care instructions were provided.  Philis Fendt, MS, PA-C 7:02 PM, 08/24/2014

## 2014-08-27 ENCOUNTER — Ambulatory Visit (INDEPENDENT_AMBULATORY_CARE_PROVIDER_SITE_OTHER): Payer: Self-pay | Admitting: Family Medicine

## 2014-08-27 VITALS — BP 106/78 | HR 119 | Temp 98.8°F | Resp 20 | Ht 63.0 in | Wt 158.0 lb

## 2014-08-27 DIAGNOSIS — Z4801 Encounter for change or removal of surgical wound dressing: Secondary | ICD-10-CM

## 2014-08-27 DIAGNOSIS — L02415 Cutaneous abscess of right lower limb: Secondary | ICD-10-CM

## 2014-08-27 NOTE — Progress Notes (Signed)
  Subjective:  Patient ID: Blanchard Mane, female    DOB: 12/25/1980  Age: 34 y.o. MRN: [id number]  Patient is here for a follow-up of the abscess right anterior thigh which was I&D leg. It is continued to hurt quite a lot. She is really checked under the dressing to know what it is looking like now. Still hurts her. She is taking them about twice daily.   Objective:   She has a considerable amount of induration still surrounding the wound. Packing was removed and a good deal of pain.  Procedure: The wound was anesthetized again with 2% lidocaine local. Packing was reinserted. There are actually 2 holes where the abscess was. Both holes are adjacent to each other separated by about 0.5 cm. This was repacked through both holes, with fairly long strips of packing into each. There is a sizable cavity which will take some time healing in.  Assessment & Plan:   Assessment:  Abscess right posterior thigh, responding to treatment, repacked with local ancethesia  Plan: This will probably take a couple of weeks to feel excellent. It was healing slowly over the next week.  Patient Instructions  Continue the anabiotic.  Try to avoid getting it wet. If the dressing gets wet you can change it.  Otherwise plan to return on Monday for a repeat change dressing and packing.  Do good handwashing     Jeanette Moffatt, MD 08/27/2014

## 2014-08-27 NOTE — Patient Instructions (Signed)
Continue the anabiotic.  Try to avoid getting it wet. If the dressing gets wet you can change it.  Otherwise plan to return on Monday for a repeat change dressing and packing.  Do good handwashing

## 2014-08-29 ENCOUNTER — Ambulatory Visit (INDEPENDENT_AMBULATORY_CARE_PROVIDER_SITE_OTHER): Payer: Self-pay | Admitting: Physician Assistant

## 2014-08-29 VITALS — BP 122/80 | HR 97 | Temp 98.7°F | Resp 16 | Ht 63.0 in | Wt 158.0 lb

## 2014-08-29 DIAGNOSIS — Z4801 Encounter for change or removal of surgical wound dressing: Secondary | ICD-10-CM

## 2014-08-29 DIAGNOSIS — Z5189 Encounter for other specified aftercare: Secondary | ICD-10-CM

## 2014-08-29 NOTE — Progress Notes (Signed)
   Subjective:    Patient ID: Leah Green, female    DOB: May 26, 1980, 34 y.o.   MRN: [id number]  Chief Complaint  Patient presents with  . Dressing Change    rt. leg   HPI  50 yof returns for wound check.   Initially seen 8/10 had I&D of right posterior thigh abscess. Packing placed. Started on bactrim, had surrounding cellulitis. Returned 8/13 for wound check. Repacked. Noted there were actually 2 small holes in skin over area which were both individually packed. Still taking bactrim.   Today returns stating she is doing well. No fevers, chills. Doing dressing changes. Taking bactrim bid.   Review of Systems     Objective:   Physical Exam  Constitutional:  BP 122/80 mmHg  Pulse 97  Temp(Src) 98.7 F (37.1 C) (Oral)  Resp 16  Ht 5\' 3"  (1.6 m)  Wt 158 lb (71.668 kg)  BMI 28.00 kg/m2  SpO2 99%  LMP 08/09/2014   Skin:  Dressing removed from wound. Two packings removed. Minimal amnt induration surrounding wound. Very small amnt tissue remaining between two holes. This tissue was removed leaving one hole approx 16mm wide. Wound approx 25mm deep with no tracts. Granulation tissue in wound bed with large amnt slough in wound bed which was removed.       Assessment & Plan:   Encounter for wound care --wound care as above --pt to rtc in 2 days for wound check, wound was not repacked today but will have her return in 2 days to monitor status of wound bed as had large amnt slough today  Julieta Gutting, PA-C Physician Assistant-Certified [facility]  08/29/2014 10:06 AM

## 2014-08-31 ENCOUNTER — Encounter: Payer: Self-pay | Admitting: Family Medicine

## 2014-08-31 ENCOUNTER — Ambulatory Visit (INDEPENDENT_AMBULATORY_CARE_PROVIDER_SITE_OTHER): Payer: Self-pay | Admitting: Family Medicine

## 2014-08-31 VITALS — BP 120/70 | HR 94 | Temp 98.3°F | Resp 18 | Ht 63.5 in | Wt 159.0 lb

## 2014-08-31 DIAGNOSIS — L03119 Cellulitis of unspecified part of limb: Secondary | ICD-10-CM

## 2014-08-31 DIAGNOSIS — L02419 Cutaneous abscess of limb, unspecified: Secondary | ICD-10-CM

## 2014-08-31 NOTE — Progress Notes (Signed)
   Subjective:    Patient ID: Leah Green, female    DOB: 1980-07-23, 34 y.o.   MRN: [id number]  HPI This is a pleasant 34 yo female who presents today for wound care. She had an I&D of right posterior thigh 08/24/14. She has been seen twice in the interim for wound care. When she was seen two days ago, she did not require packing, but slough tissue was removed. She changed the dressing and has had little drainage. She is currently not having any pain, swelling, fever or chills.  She is taking Bactim DS BID and has 3 days left. She has noticed some stomach discomfort which she was not sure if it was related to her menses or medication.   Review of Systems Per HPI    Objective:   Physical Exam Female in NAD Right posterior thigh dressing removed, small amount serous drainage. Wound opening approximately 8 mm wide and 5 mm deep. Bright pink tissue, no slough, no purulence. No induration, no erythema, no warmth. Irrigated with 5cc lidocaine 1%, explored, no tracts. Surrounding skin cleaned with soap and water. Dressing applied.   BP 120/70 mmHg  Pulse 94  Temp(Src) 98.3 F (36.8 C) (Oral)  Resp 18  Ht 5' 3.5" (1.613 m)  Wt 159 lb (72.122 kg)  BMI 27.72 kg/m2  LMP 08/09/2014     Assessment & Plan:  1. Cellulitis and abscess of leg - wound healing appropriately, no longer requires packing, no debridement required today - instructions given to patient to wash with mild soap and water daily, cover while wound open, trying to leave open to air a couple of hours a day if possible - RTC if any fever/chills, pain, swelling, redness, drainage- patient verbalized understanding - suggested she take Bactrim DS with food and full glass of water.    Clarene Reamer, FNP-BC  [facility]  08/31/2014 10:11 AM

## 2014-09-02 ENCOUNTER — Ambulatory Visit (INDEPENDENT_AMBULATORY_CARE_PROVIDER_SITE_OTHER): Payer: Self-pay | Admitting: Internal Medicine

## 2014-09-02 VITALS — BP 118/80 | HR 99 | Temp 99.1°F | Resp 16 | Ht 63.0 in | Wt 158.2 lb

## 2014-09-02 DIAGNOSIS — L509 Urticaria, unspecified: Secondary | ICD-10-CM

## 2014-09-02 DIAGNOSIS — T887XXA Unspecified adverse effect of drug or medicament, initial encounter: Secondary | ICD-10-CM

## 2014-09-02 DIAGNOSIS — T50905A Adverse effect of unspecified drugs, medicaments and biological substances, initial encounter: Secondary | ICD-10-CM

## 2014-09-02 MED ORDER — PREDNISONE 20 MG PO TABS: ORAL_TABLET | ORAL | Status: DC

## 2014-09-02 NOTE — Progress Notes (Signed)
   Subjective:  This chart was scribed for Leah Lin, MD by [facility], medical scribe at [facility].The patient was seen in exam room 12 and the patient's care was started at 5:19 PM.   Patient ID: Leah Green, female    DOB: [birth year], 33 y.o.   MRN: [id number] Chief Complaint  Patient presents with  . Rash    x this morning    HPI HPI Comments: Leah Green is a 34 y.o. female who presents to [facility] complaining of rash and lip swelling onset this morning. She has been on bactrim since 08/24/2014 for an abscess on the posterior upper right leg. The abscess has improved. She denies tongue or throat swelling, shortness of breath, and chest tightness.  No fever/not itching Past Medical History  Diagnosis Date  . Allergy    Prior to Admission medications   Medication Sig Start Date End Date Taking? Authorizing Provider  sulfamethoxazole-trimethoprim (BACTRIM DS,SEPTRA DS) 800-160 MG per tablet Take 1 tablet by mouth 2 (two) times daily. 08/24/14  Yes Roselee Culver, MD   Allergies  Allergen Reactions  . Bee Venom Anaphylaxis   Review of Systems  HENT: Positive for facial swelling.   Respiratory: Negative for chest tightness and shortness of breath.   Skin: Positive for rash.      Objective:  BP 118/80 mmHg  Pulse 99  Temp(Src) 99.1 F (37.3 C) (Oral)  Resp 16  Ht 5\' 3"  (1.6 m)  Wt 158 lb 3.2 oz (71.759 kg)  BMI 28.03 kg/m2  SpO2 98%  LMP 08/09/2014 Physical Exam  Constitutional: She is oriented to person, place, and time. She appears well-developed and well-nourished. No distress.  HENT:  Head: Normocephalic and atraumatic.  Eyes: Pupils are equal, round, and reactive to light.  Neck: Normal range of motion.  Cardiovascular: Normal rate and regular rhythm.   Pulmonary/Chest: Effort normal and breath sounds normal. No respiratory distress. She has no wheezes.  Musculoskeletal: Normal range of motion.    Neurological: She is alert and oriented to person, place, and time.  Skin: Skin is warm and dry.  Small hives too numerous to count over entire body incl face Mild swell lower lip No intraoral lesions or tongue swelling  Psychiatric: She has a normal mood and affect. Her behavior is normal.  Nursing note and vitals reviewed. abscess without pus or redness or induration Still not completely closed    Assessment & Plan:  Hives  Drug reaction, initial encounter --septra!!!-d/ced  Meds ordered this encounter  Medications  . predniSONE (DELTASONE) 20 MG tablet    Sig: 3/3/2/2/1/1 single daily dose for 6 days    Dispense:  12 tablet    Refill:  0   Keep wound clean scrubbing vig 2x day     I have completed the patient encounter in its entirety as documented by the scribe, with editing by me where necessary. Teana Lindahl P. Laney Pastor, M.D.

## 2014-09-06 ENCOUNTER — Other Ambulatory Visit: Payer: Self-pay | Admitting: Internal Medicine

## 2014-09-06 NOTE — Telephone Encounter (Signed)
Pharmacy Notes:PT MISUNDERSTOOD DOSING & HAS ONLY BEEN TAKING 1 DAILY. RASH/HIVES STILL PRESENT. HAS 6 TABS LEFT. ADVISED TO TAKE 60 MG 8/23 & 8/24. NEEDS 6 MORE TABS TO COMPLETE THERAPY Can we send in six more?

## 2015-01-06 ENCOUNTER — Ambulatory Visit (INDEPENDENT_AMBULATORY_CARE_PROVIDER_SITE_OTHER): Payer: Self-pay | Admitting: Physician Assistant

## 2015-01-06 VITALS — BP 120/84 | HR 91 | Temp 98.2°F | Resp 18 | Ht 64.25 in | Wt 169.4 lb

## 2015-01-06 DIAGNOSIS — M62838 Other muscle spasm: Secondary | ICD-10-CM

## 2015-01-06 DIAGNOSIS — M6248 Contracture of muscle, other site: Secondary | ICD-10-CM

## 2015-01-06 DIAGNOSIS — G43119 Migraine with aura, intractable, without status migrainosus: Secondary | ICD-10-CM

## 2015-01-06 MED ORDER — CYCLOBENZAPRINE HCL 5 MG PO TABS: 5.0000 mg | ORAL_TABLET | Freq: Three times a day (TID) | ORAL | Status: DC | PRN

## 2015-01-06 MED ORDER — DICLOFENAC SODIUM 75 MG PO TBEC: 75.0000 mg | DELAYED_RELEASE_TABLET | Freq: Two times a day (BID) | ORAL | Status: AC

## 2015-01-06 NOTE — Patient Instructions (Signed)
Heat and massage to the area. Continue the heat to the neck and ice to the head when you are working off.  Recheck in 2 weeks
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
## 2015-01-16 ENCOUNTER — Encounter: Payer: Self-pay | Admitting: Physician Assistant

## 2015-01-26 ENCOUNTER — Encounter: Payer: Self-pay | Admitting: Physician Assistant

## 2015-01-26 DIAGNOSIS — M62838 Other muscle spasm: Secondary | ICD-10-CM

## 2015-01-26 MED ORDER — CYCLOBENZAPRINE HCL 5 MG PO TABS
5.0000 mg | ORAL_TABLET | Freq: Three times a day (TID) | ORAL | Status: AC | PRN
Start: 2015-01-26 — End: ?

## 2015-01-26 NOTE — Telephone Encounter (Signed)
Done

## 2015-02-09 ENCOUNTER — Ambulatory Visit (INDEPENDENT_AMBULATORY_CARE_PROVIDER_SITE_OTHER): Payer: Self-pay | Admitting: Physician Assistant

## 2015-02-09 ENCOUNTER — Encounter: Payer: Self-pay | Admitting: Physician Assistant

## 2015-02-09 VITALS — BP 120/74 | HR 100 | Temp 98.3°F | Resp 20 | Ht 64.0 in | Wt 171.8 lb

## 2015-02-09 DIAGNOSIS — L309 Dermatitis, unspecified: Secondary | ICD-10-CM

## 2015-02-09 DIAGNOSIS — Z23 Encounter for immunization: Secondary | ICD-10-CM

## 2015-02-09 DIAGNOSIS — Z7184 Encounter for health counseling related to travel: Secondary | ICD-10-CM

## 2015-02-09 DIAGNOSIS — Z7189 Other specified counseling: Secondary | ICD-10-CM

## 2015-02-09 MED ORDER — MEFLOQUINE HCL 250 MG PO TABS
250.0000 mg | ORAL_TABLET | ORAL | Status: DC
Start: 2015-02-09 — End: 2015-02-18

## 2015-02-09 MED ORDER — MOMETASONE FUROATE 0.1 % EX OINT: TOPICAL_OINTMENT | Freq: Every day | CUTANEOUS | Status: AC

## 2015-02-09 MED ORDER — TYPHOID VACCINE PO CPDR: 1.0000 | DELAYED_RELEASE_CAPSULE | ORAL | Status: DC

## 2015-02-09 NOTE — Patient Instructions (Signed)
You got your 2nd hep A today. You are good on tdap. I will call you with your hep b results. Either take typhoid pills or go to health dept to get vaccine. Take mefloquine weekly for 8 weeks starting 2 weeks before you leave.  Return as needed.

## 2015-02-09 NOTE — Progress Notes (Signed)
[facility] [address] [place] [address] [phone]  Date:  02/09/2015   Name:  Leah Green   DOB:  1980-10-30   MRN:  [id number]  PCP:  No PCP Per Patient    Chief Complaint: Immunizations and Eczema   History of Present Illness:  This is a 35 y.o. female who is presenting needing travel related immunizations. She is planning to go to [place] in March and June for mission trips. She is going to [place] in March for 2 weeks and [place] in June for 2 weeks. Tdap required. Hep A, Hep B and typhoid recommended. Anti-malarials recommended.  Tdap 2010. Hep A first vaccine 2010 Hep B - thinks got as a child  Pt also wanting steroid cream for dermatitis. She states she is allergic to her bodily fluids. She gets dermatitis where she sweats. She is currently having an outbreak on her abdomen. Elocon cream has worked well in the past.  Review of Systems:  Review of Systems See HPI  Patient Active Problem List   Diagnosis Date Noted  . Hemangioma of intra-abdominal structure ??-- 04/24/2012  . 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 Authorizing Provider  cyclobenzaprine (FLEXERIL) 5 MG tablet Take 1 tablet (5 mg total) by mouth 3 (three) times daily as needed for muscle spasms. 01/26/15  Yes Mancel Bale, PA-C  diclofenac (VOLTAREN) 75 MG EC tablet Take 1 tablet (75 mg total) by mouth 2 (two) times daily. Patient not taking: Reported on 02/09/2015 01/06/15   Mancel Bale, PA-C    Allergies  Allergen Reactions  . Bee Venom Anaphylaxis  . Septra [Sulfamethoxazole-Trimethoprim] Hives    Past Surgical History  Procedure Laterality Date  . Cholecystectomy      Social History  Substance Use Topics  . Smoking status: Never Smoker   . Smokeless tobacco: Never Used  . Alcohol Use: 0.6 oz/week    1 Glasses of wine per week    Family History  Problem Relation Age of  Onset  . Alzheimer's disease Maternal Grandmother   . Diabetes Paternal Grandmother   . Epilepsy Sister   . Epilepsy Mother     Medication list has been reviewed and updated.  Physical Examination:  Physical Exam  Constitutional: She is oriented to person, place, and time. She appears well-developed and well-nourished. No distress.  HENT:  Head: Normocephalic and atraumatic.  Right Ear: Hearing normal.  Left Ear: Hearing normal.  Nose: Nose normal.  Eyes: Conjunctivae and lids are normal. Right eye exhibits no discharge. Left eye exhibits no discharge. No scleral icterus.  Pulmonary/Chest: Effort normal. No respiratory distress.  Musculoskeletal: Normal range of motion.  Neurological: She is alert and oriented to person, place, and time.  Skin: Skin is warm, dry and intact.  Dry skin patches on right side of abdomen.  Psychiatric: She has a normal mood and affect. Her speech is normal and behavior is normal. Thought content normal.    BP 120/74 mmHg  Pulse 100  Temp(Src) 98.3 F (36.8 C) (Oral)  Resp 20  Ht 5\' 4"  (1.626 m)  Wt 171 lb 12.8 oz (77.928 kg)  BMI 29.47 kg/m2  SpO2 99%  LMP 01/12/2015  Assessment and Plan:  1. Travel advice encounter 2nd hep A given today. Will check immunity to hep B. Oral typhoid sent to pharmacy or advised she can go to health dept for vaccine. Mefloquine sent to  pharmacy. Return as needed. - Hepatitis A vaccine adult IM - Hepatitis B surface antibody - typhoid (VIVOTIF) DR capsule; Take 1 capsule by mouth every other day. Take one capsule by mouth every other day for a week (day 1, day 3, day 5, and day 7). The last dose should be given at least 1 week before travel to allow the vaccine time to work. Swallow each dose about an hour before a meal with a cold or lukewarm drink. Do not chew the capsule.  Dispense: 4 capsule; Refill: 0 - mefloquine (LARIAM) 250 MG tablet; Take 1 tablet (250 mg total) by mouth every 7 (seven) days.  Dispense: 8  tablet; Refill: 0  2. Dermatitis Elocon has worked well in the past for dermatitis. Refilled. - mometasone (ELOCON) 0.1 % ointment; Apply topically daily.  Dispense: 45 g; Refill: 0   Aliveah Gallant V. Drenda Freeze, MHS [facility]  02/09/2015

## 2015-02-10 LAB — HEPATITIS B SURFACE ANTIBODY, QUANTITATIVE: HEPATITIS B-POST: 86.8 m[IU]/mL

## 2015-02-15 ENCOUNTER — Encounter: Payer: Self-pay | Admitting: Physician Assistant

## 2015-02-15 DIAGNOSIS — Z7184 Encounter for health counseling related to travel: Secondary | ICD-10-CM

## 2015-02-18 MED ORDER — MEFLOQUINE HCL 250 MG PO TABS: 250.0000 mg | ORAL_TABLET | ORAL | Status: DC

## 2015-02-18 NOTE — Telephone Encounter (Signed)
Pt states mefloquine was not at her pharmacy. I have resent.

## 2015-02-26 ENCOUNTER — Encounter: Payer: Self-pay | Admitting: Physician Assistant

## 2015-02-26 DIAGNOSIS — Z7184 Encounter for health counseling related to travel: Secondary | ICD-10-CM

## 2015-02-27 MED ORDER — TYPHOID VACCINE PO CPDR: 1.0000 | DELAYED_RELEASE_CAPSULE | ORAL | Status: DC

## 2015-02-27 NOTE — Telephone Encounter (Signed)
Typhoid reordered and sent to pharmacy

## 2015-02-28 MED ORDER — TYPHOID VACCINE PO CPDR: 1.0000 | DELAYED_RELEASE_CAPSULE | ORAL | Status: DC

## 2015-02-28 NOTE — Addendum Note (Signed)
Addended by: Ezekiel Slocumb on: 02/28/2015 08:54 AM   Modules accepted: Orders

## 2015-03-01 MED ORDER — TYPHOID VACCINE PO CPDR: 1.0000 | DELAYED_RELEASE_CAPSULE | ORAL | Status: AC

## 2015-03-01 NOTE — Addendum Note (Signed)
Addended by: Ezekiel Slocumb on: 03/01/2015 03:27 PM   Modules accepted: Orders

## 2015-03-01 NOTE — Telephone Encounter (Signed)
vivotif will be faxed to pharmacy.

## 2015-06-08 ENCOUNTER — Encounter: Payer: Self-pay | Admitting: Physician Assistant

## 2015-06-08 ENCOUNTER — Other Ambulatory Visit: Payer: Self-pay | Admitting: Physician Assistant

## 2015-06-10 ENCOUNTER — Other Ambulatory Visit: Payer: Self-pay | Admitting: Physician Assistant

## 2015-06-10 DIAGNOSIS — Z7184 Encounter for health counseling related to travel: Secondary | ICD-10-CM

## 2015-06-10 MED ORDER — MEFLOQUINE HCL 250 MG PO TABS
250.0000 mg | ORAL_TABLET | ORAL | Status: AC
Start: 2015-06-10 — End: ?

## 2015-06-23 IMAGING — CR DG CERVICAL SPINE COMPLETE 4+V
5 series · 5 of 5 positions shown · non-contrast
Comparison: None.

CLINICAL DATA: Pain with radicular symptoms

EXAM:
CERVICAL SPINE  4+ VIEWS

[lpo]
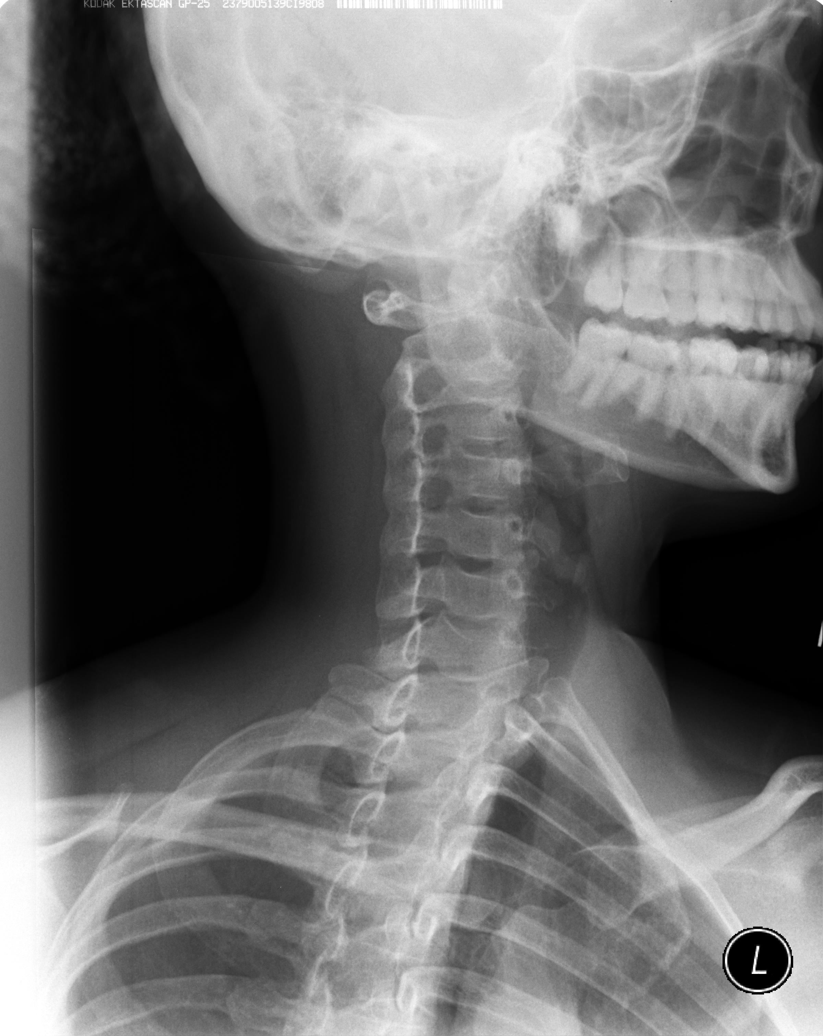

[lateral]
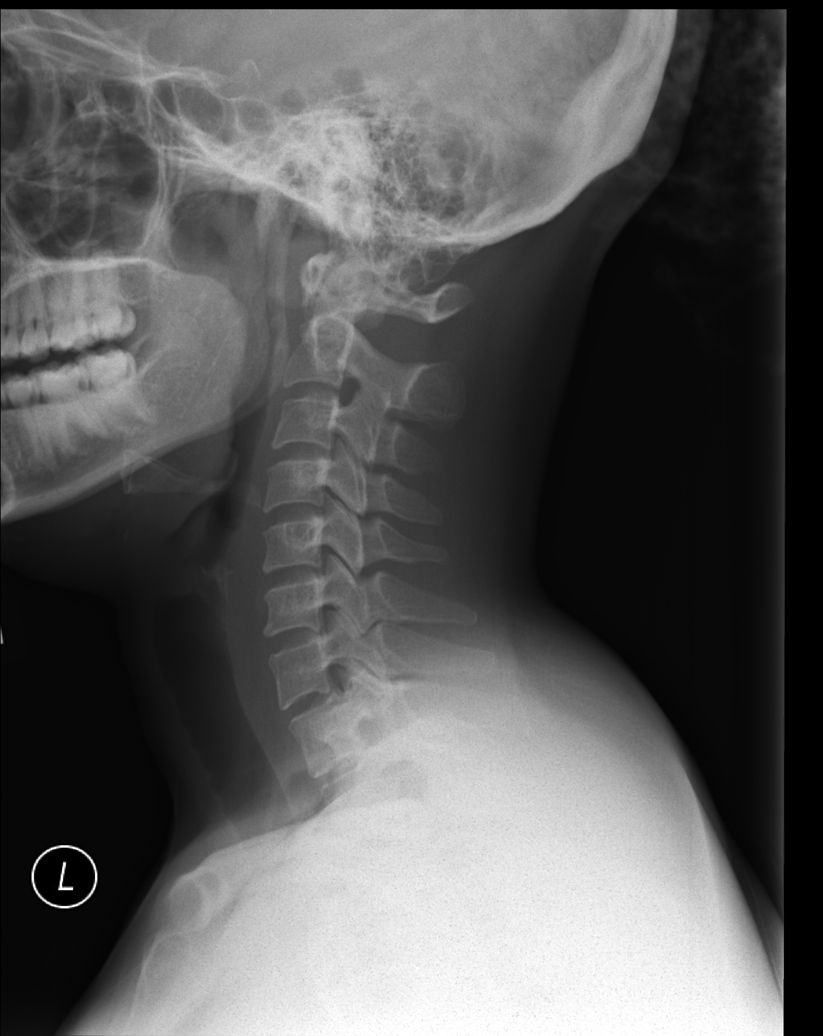

[rpo]
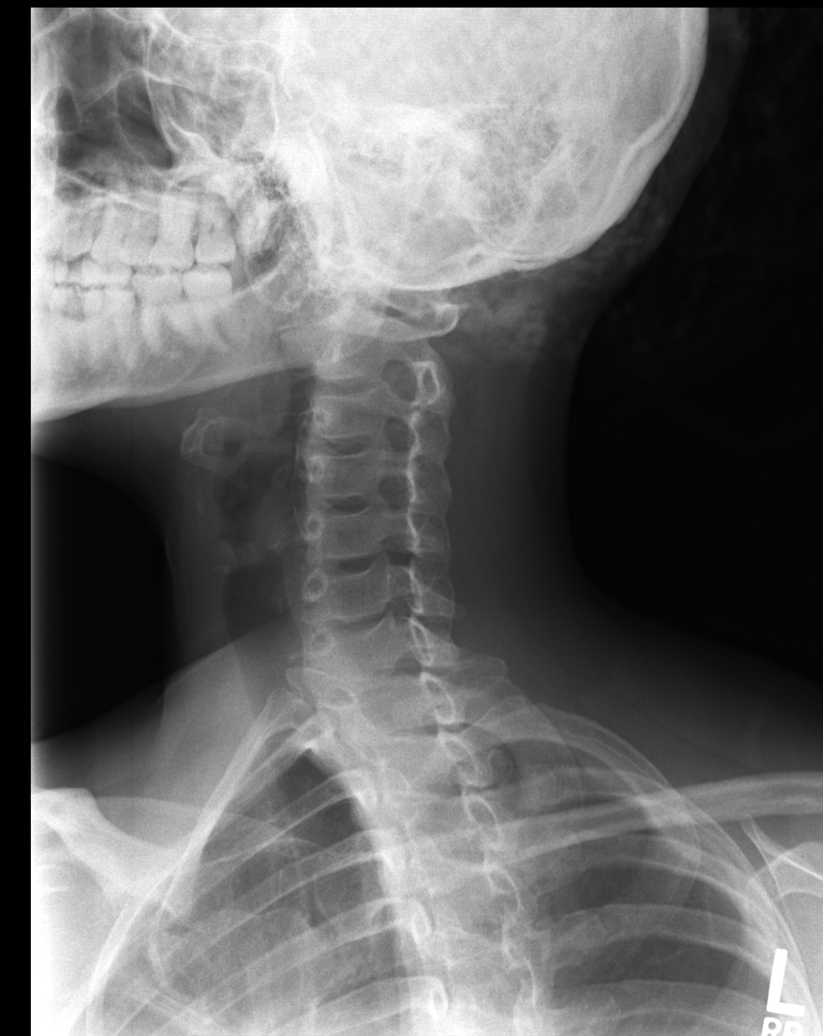

[AP]
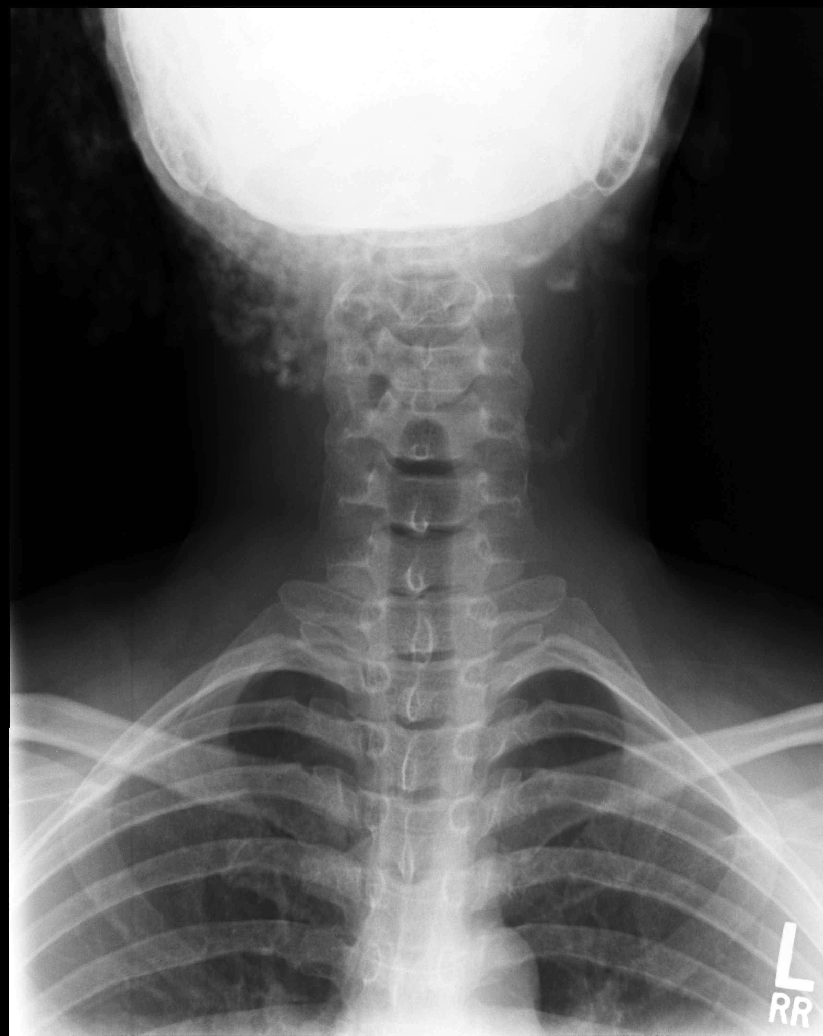

[ap open mouth]
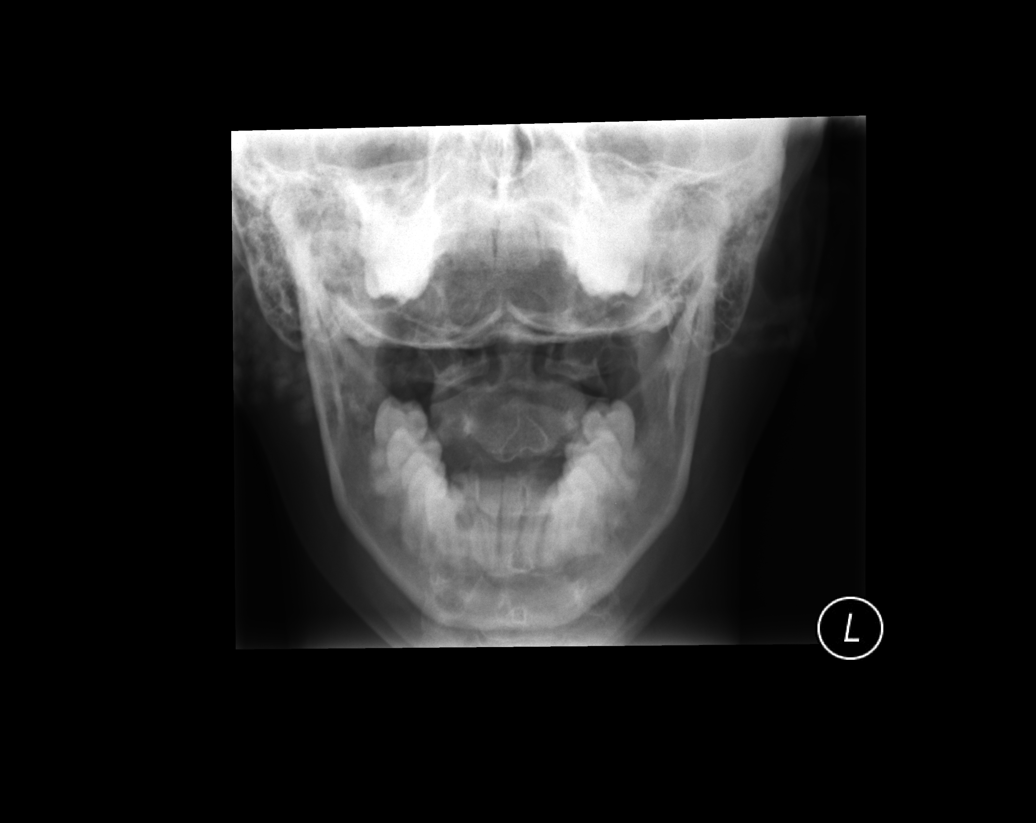

[5 of 5 positions shown; findings below may reference images not displayed]

FINDINGS: Frontal, lateral, open-mouth odontoid, and bilateral oblique views
were obtained. There is no fracture or spondylolisthesis.
Prevertebral soft tissues and predental space regions are normal.
Disc spaces appear intact. There is no appreciable facet
arthropathy.
IMPRESSION: No fracture or appreciable arthropathic change.

## 2016-07-11 ENCOUNTER — Ambulatory Visit (INDEPENDENT_AMBULATORY_CARE_PROVIDER_SITE_OTHER): Payer: Self-pay | Admitting: Family Medicine

## 2016-07-11 ENCOUNTER — Encounter: Payer: Self-pay | Admitting: Family Medicine

## 2016-07-11 VITALS — BP 114/79 | HR 85 | Temp 98.2°F | Resp 18 | Ht 63.9 in | Wt 178.2 lb

## 2016-07-11 DIAGNOSIS — Z021 Encounter for pre-employment examination: Secondary | ICD-10-CM

## 2016-07-11 NOTE — Patient Instructions (Addendum)
You must return between 1 and 4:00 on Saturday afternoon to have the PPD read. Make sure you're paper is signed off at that time.    IF you received an x-ray today, you will receive an invoice from [facility]. Please contact [facility] at [phone] with questions or concerns regarding your invoice.   IF you received labwork today, you will receive an invoice from [facility]. Please contact [facility] at [phone] with questions or concerns regarding your invoice.   Our billing staff will not be able to assist you with questions regarding bills from these companies.  You will be contacted with the lab results as soon as they are available. The fastest way to get your results is to activate your My Chart account. Instructions are located on the last page of this paperwork. If you have not heard from [place] regarding the results in 2 weeks, please contact this office.

## 2016-07-11 NOTE — Progress Notes (Signed)
Patient ID: Leah Green, female    DOB: August 04, 1980  Age: 36 y.o. MRN: [id number]  Chief Complaint  Patient presents with  . 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 Review of systems is fairly unremarkable. 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 She has not had a recent PPD. No major surgeries. No major illnesses. No regular medications. She is allergic to sulfa.  Immunization History  Administered Date(s) Administered  . Hepatitis A, Adult 02/09/2015  . PPD Test 07/11/2016  rgies, medications, problem list, past/family and social histories reviewed.  Objective:  BP 114/79 (BP Location: Right Arm, Patient Position: Sitting, Cuff Size: Normal)   Pulse 85   Temp 98.2 F (36.8 C) (Oral)   Resp 18   Ht 5' 3.9" (1.623 m)   Wt 178 lb 3.2 oz (80.8 kg)   LMP 06/28/2016 (Approximate)   SpO2 98%   BMI 30.69 kg/m   Pleasant lady alert and oriented in no acute distress. TMs normal. Eyes PERRLA. Throat clear. Neck supple without nodes or thyromegaly. Chest clear. Heart regular without murmur. Himself without masses. Mild nonspecific right lower quadrant tenderness. Extremities unremarkable. Skin unremarkable.  Assessment & Plan:   Assessment: 1. Physical exam, pre-employment       Plan: She is cleared for the department of social services exam. She does need a PPD.  Orders Placed This Encounter  Procedures  . TB Skin Test    Order Specific Question:   Has patient ever tested positive?    Answer:   No    No orders of the defined types were placed in this encounter.        Patient Instructions   You must return between 1 and 4:00 on Saturday afternoon to have the PPD read. Make sure you're paper is signed off at that time.    IF you received an x-ray today, you will receive an invoice from  [facility]. Please contact [facility] at [phone] with questions or concerns regarding your invoice.   IF you received labwork today, you will receive an invoice from [facility]. Please contact [facility] at [phone] with questions or concerns regarding your invoice.   Our billing staff will not be able to assist you with questions regarding bills from these companies.  You will be contacted with the lab results as soon as they are available. The fastest way to get your results is to activate your My Chart account. Instructions are located on the last page of this paperwork. If you have not heard from [place] regarding the results in 2 weeks, please contact this office.         No Follow-up on file.   Modena Bellemare, MD 07/11/2016

## 2016-07-13 ENCOUNTER — Ambulatory Visit (INDEPENDENT_AMBULATORY_CARE_PROVIDER_SITE_OTHER): Payer: Self-pay | Admitting: Family Medicine

## 2016-07-13 DIAGNOSIS — Z111 Encounter for screening for respiratory tuberculosis: Secondary | ICD-10-CM

## 2016-07-13 LAB — TB SKIN TEST: TB Skin Test: NEGATIVE

## 2016-07-15 NOTE — Progress Notes (Signed)
Pt not seen by provider.   Results for orders placed or performed in visit on 07/11/16  TB Skin Test  Result Value Ref Range   TB Skin Test Negative    Induration 0 mm mm

## 2016-07-23 ENCOUNTER — Telehealth: Payer: Self-pay | Admitting: Family Medicine

## 2016-07-23 NOTE — Telephone Encounter (Signed)
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

## 2016-07-23 NOTE — Telephone Encounter (Signed)
PT CALLING BACK ABOUT FORM

## 2016-07-26 NOTE — Telephone Encounter (Signed)
Pt came to the office 7/12 to pick up form

## 2018-10-14 ENCOUNTER — Other Ambulatory Visit: Payer: Self-pay

## 2018-10-14 DIAGNOSIS — Z20822 Contact with and (suspected) exposure to covid-19: Secondary | ICD-10-CM

## 2018-10-15 LAB — NOVEL CORONAVIRUS, NAA: SARS-CoV-2, NAA: NOT DETECTED

## 2018-11-04 ENCOUNTER — Other Ambulatory Visit: Payer: Self-pay

## 2018-11-04 DIAGNOSIS — Z20822 Contact with and (suspected) exposure to covid-19: Secondary | ICD-10-CM

## 2018-11-05 LAB — NOVEL CORONAVIRUS, NAA: SARS-CoV-2, NAA: NOT DETECTED

## 2019-01-06 ENCOUNTER — Ambulatory Visit: Payer: Self-pay | Attending: Internal Medicine

## 2019-01-06 DIAGNOSIS — Z20822 Contact with and (suspected) exposure to covid-19: Secondary | ICD-10-CM

## 2019-01-08 LAB — NOVEL CORONAVIRUS, NAA: SARS-CoV-2, NAA: NOT DETECTED

## 2019-02-02 ENCOUNTER — Ambulatory Visit: Payer: Self-pay | Attending: Internal Medicine

## 2019-02-02 DIAGNOSIS — Z20822 Contact with and (suspected) exposure to covid-19: Secondary | ICD-10-CM | POA: Insufficient documentation

## 2019-02-03 LAB — NOVEL CORONAVIRUS, NAA: SARS-CoV-2, NAA: NOT DETECTED
